# Patient Record
Sex: Female | Born: 2000 | Race: White | Hispanic: No | Marital: Married | Smoking: Never smoker
Health system: Southern US, Community
[De-identification: ages and names within clinical notes are randomized; demographics above are authoritative.]

## PROBLEM LIST (undated history)

## (undated) ENCOUNTER — Emergency Department (HOSPITAL_COMMUNITY): Payer: Managed Care, Other (non HMO)

## (undated) DIAGNOSIS — R569 Unspecified convulsions: Secondary | ICD-10-CM

## (undated) HISTORY — PX: TONSILECTOMY, ADENOIDECTOMY, BILATERAL MYRINGOTOMY AND TUBES: SHX2538

## (undated) HISTORY — DX: Unspecified convulsions: R56.9

---
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
## 2020-09-06 ENCOUNTER — Other Ambulatory Visit (HOSPITAL_COMMUNITY): Payer: Self-pay | Admitting: Physician Assistant

## 2020-09-06 DIAGNOSIS — R1013 Epigastric pain: Secondary | ICD-10-CM

## 2020-09-10 ENCOUNTER — Encounter (HOSPITAL_COMMUNITY): Payer: Self-pay

## 2020-09-10 ENCOUNTER — Ambulatory Visit (HOSPITAL_COMMUNITY): Payer: 59

## 2020-09-17 ENCOUNTER — Other Ambulatory Visit: Payer: Self-pay

## 2020-09-17 ENCOUNTER — Ambulatory Visit (HOSPITAL_COMMUNITY)
Admission: RE | Admit: 2020-09-17 | Discharge: 2020-09-17 | Disposition: A | Discharge status: Home / Self Care | Payer: 59 | Source: Ambulatory Visit | Attending: Physician Assistant | Admitting: Physician Assistant

## 2020-09-17 DIAGNOSIS — R1013 Epigastric pain: Secondary | ICD-10-CM | POA: Insufficient documentation

## 2022-08-30 IMAGING — US US ABDOMEN COMPLETE
1 series · 13 of 25 positions shown · non-contrast
Comparison: CT abdomen/pelvis 01/27/2013.

CLINICAL DATA: Epigastric pain. Attention right upper quadrant.
Additional history provided by scanning technologist: Patient
reports right upper quadrant/back and epigastric pain for 6 months.

EXAM:
ABDOMEN ULTRASOUND COMPLETE

[Series 1: us abdomen complete · 13 of 109 slices shown]
[im 1/109]
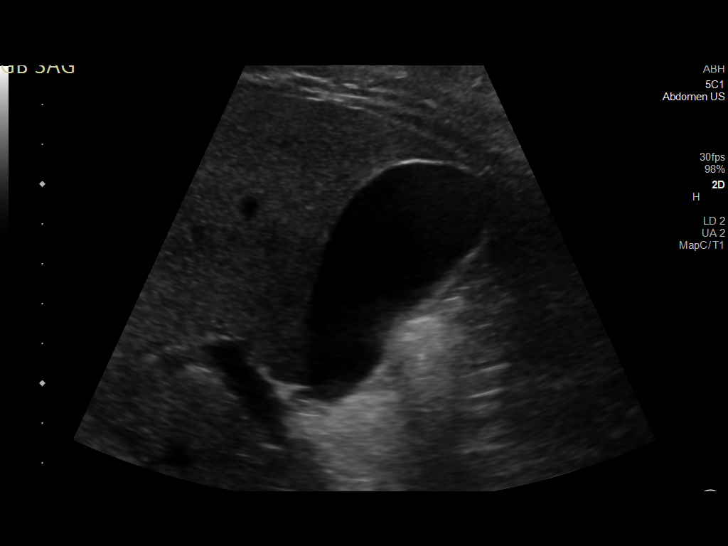
[im 10/109]
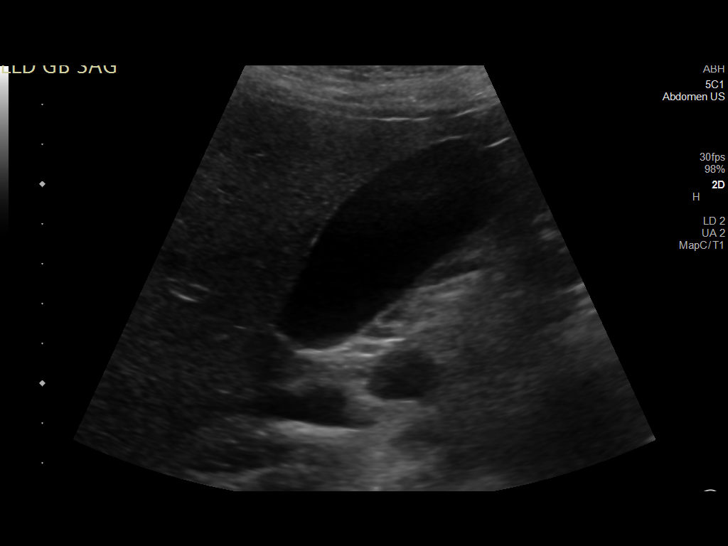
[im 19/109]
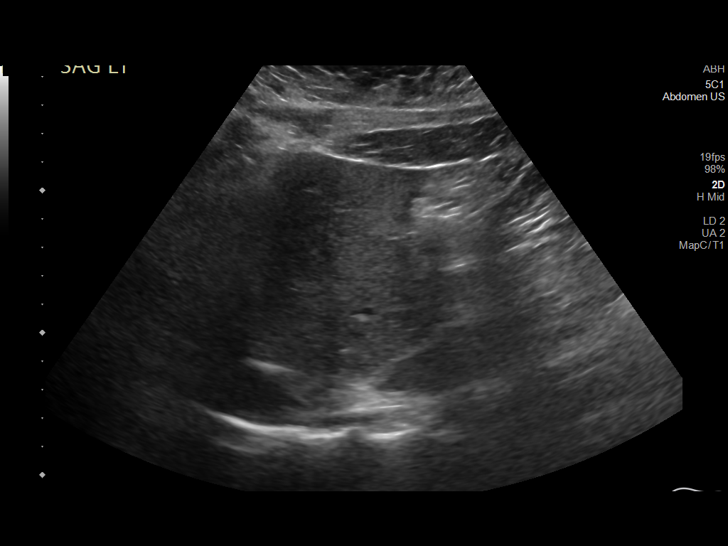
[im 28/109]
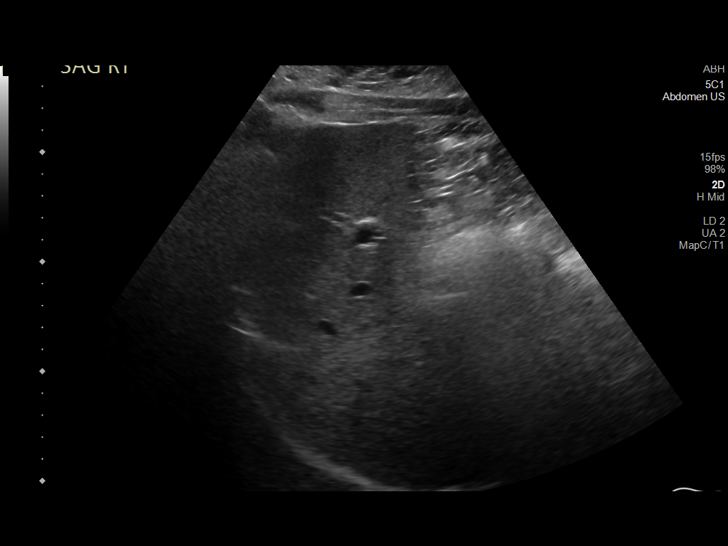
[im 37/109]
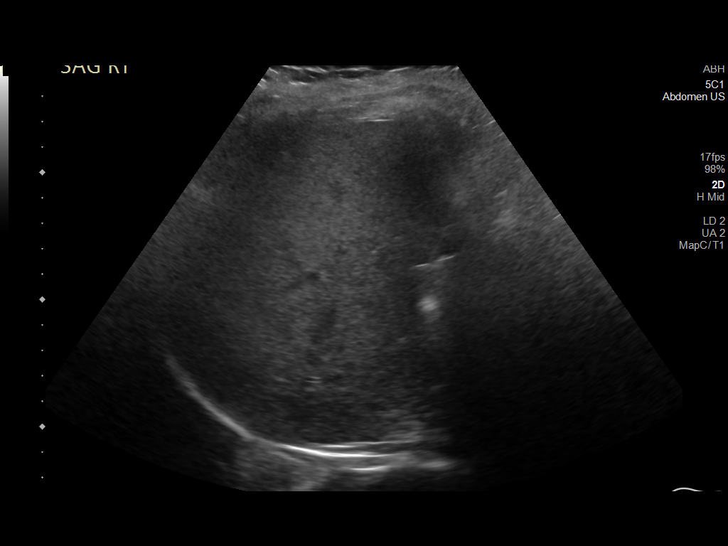
[im 46/109]
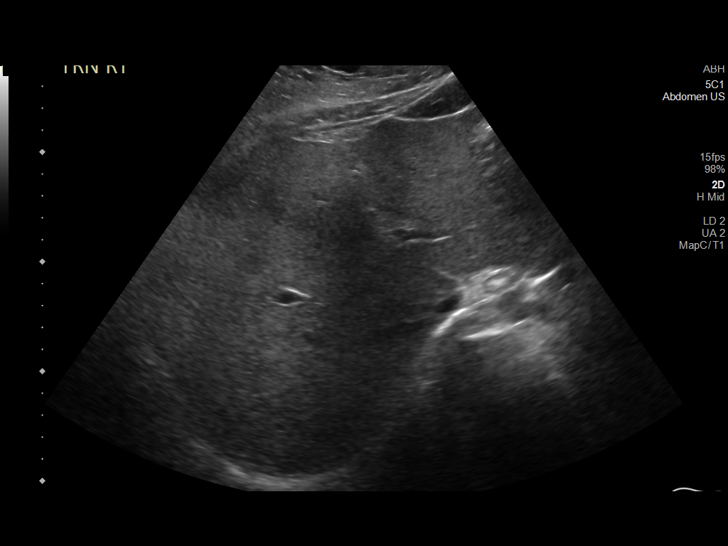
[im 55/109]
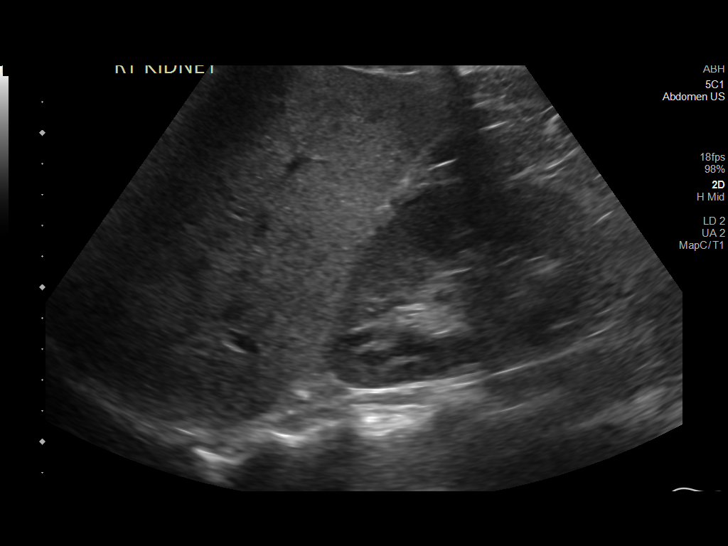
[im 64/109]
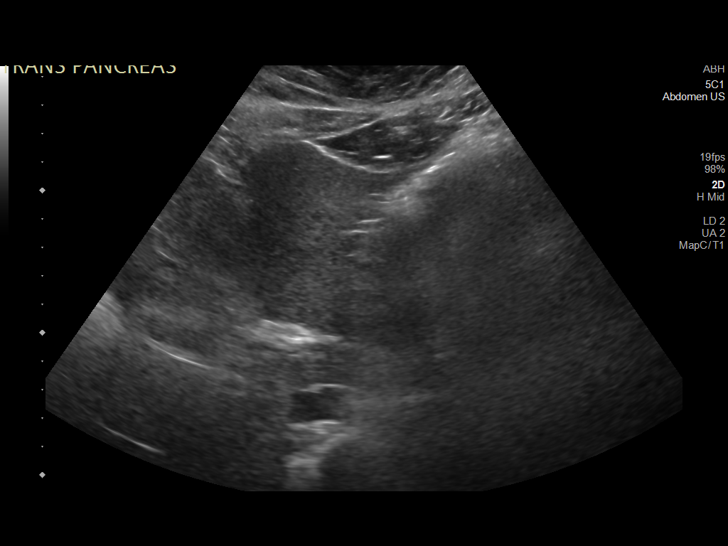
[im 73/109]
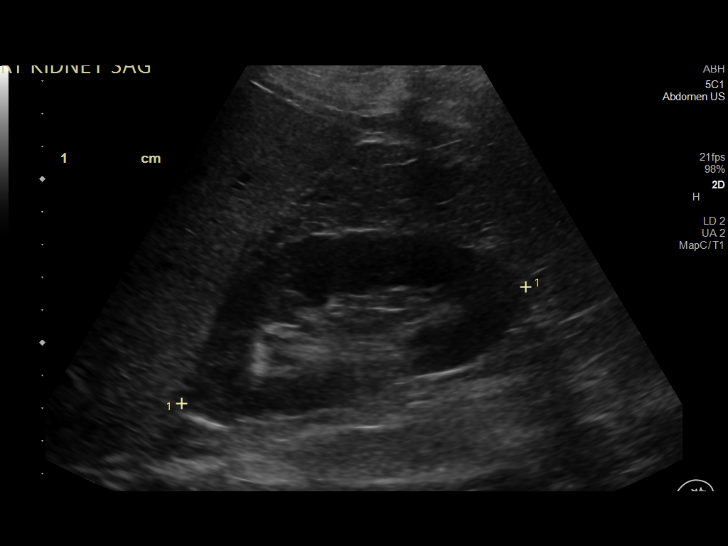
[im 82/109]
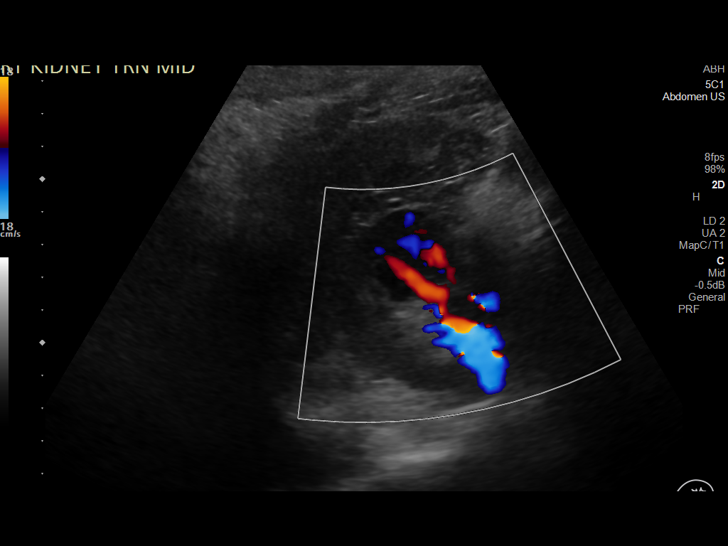
[im 91/109]
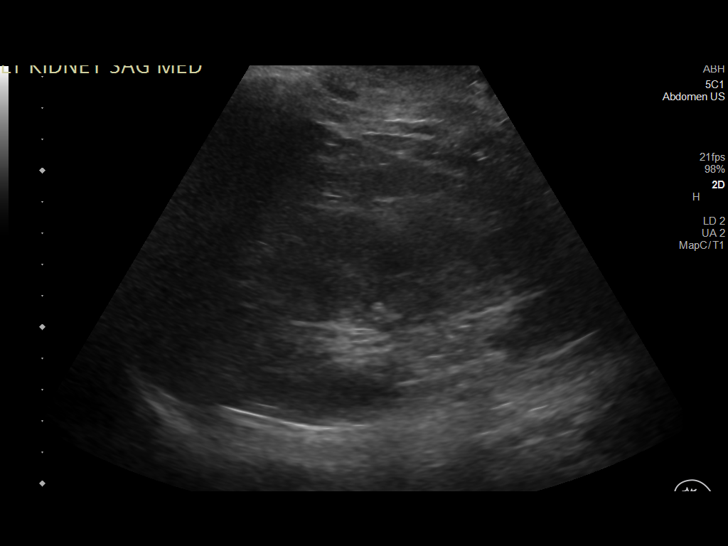
[im 100/109]
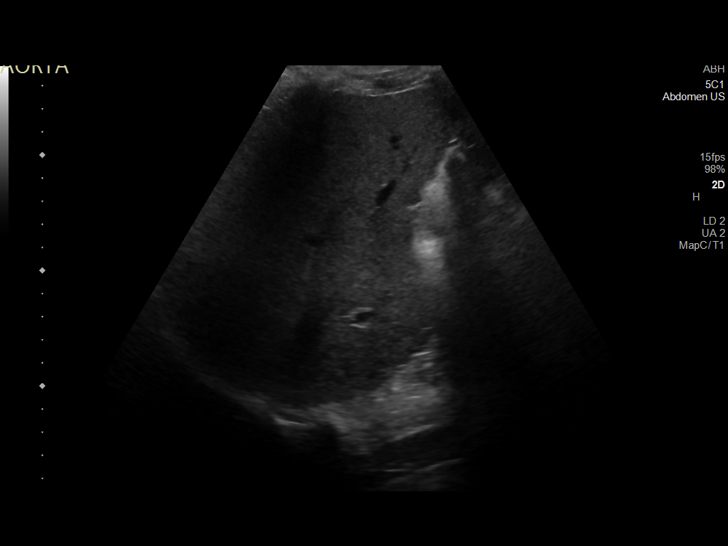
[im 109/109]
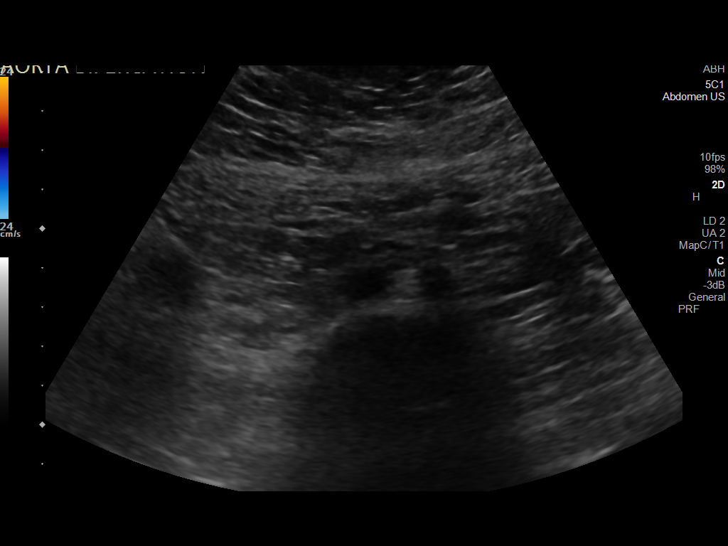

[13 of 25 positions shown; findings below may reference images not displayed]

FINDINGS: Gallbladder: No gallstones or wall thickening visualized. No
sonographic Murphy sign noted by sonographer.

Common bile duct: Diameter: 2 mm, within normal limits

Liver: No focal lesion identified. Increased hepatic parenchymal
echogenicity. Portal vein is patent on color Doppler imaging with
normal direction of blood flow towards the liver.

IVC: No abnormality visualized.

Pancreas: Nonvisualized due to obscuration by overlying bowel gas.

Spleen: Size and appearance within normal limits.

Right Kidney: Length: 11.1 cm. Echogenicity within normal limits. No
mass or hydronephrosis visualized.

Left Kidney: Length: 12.2 cm. Echogenicity within normal limits. No
mass or hydronephrosis visualized.

Abdominal aorta: No aneurysm visualized.
IMPRESSION: Hyperechogenicity of the hepatic parenchyma. This is a nonspecific
finding, which may be seen in the setting of hepatic steatosis or
other chronic hepatic parenchymal disease.

Nonvisualization of the pancreas due to obscuration by overlying
bowel gas.

Otherwise unremarkable abdominal ultrasound, as described
